# Patient Record
Sex: Male | Born: 1965 | Race: White | Hispanic: No | Marital: Married | Smoking: Never smoker
Health system: Southern US, Community
[De-identification: ages and names within clinical notes are randomized; demographics above are authoritative.]

## PROBLEM LIST (undated history)

## (undated) DIAGNOSIS — I10 Essential (primary) hypertension: Secondary | ICD-10-CM

## (undated) DIAGNOSIS — E785 Hyperlipidemia, unspecified: Secondary | ICD-10-CM

## (undated) HISTORY — PX: TONSILECTOMY, ADENOIDECTOMY, BILATERAL MYRINGOTOMY AND TUBES: SHX2538

## (undated) HISTORY — DX: Essential (primary) hypertension: I10

## (undated) HISTORY — PX: COLONOSCOPY: SHX174

## (undated) HISTORY — PX: APPENDECTOMY: SHX54

## (undated) HISTORY — PX: MYRINGOTOMY: SUR874

## (undated) HISTORY — DX: Hyperlipidemia, unspecified: E78.5

## (undated) HISTORY — PX: OTHER SURGICAL HISTORY: SHX169

## (undated) HISTORY — PX: TONSILLECTOMY: SUR1361

---

## 2021-10-05 ENCOUNTER — Other Ambulatory Visit
Admission: RE | Admit: 2021-10-05 | Discharge: 2021-10-05 | Disposition: A | Discharge status: Home / Self Care | Source: Ambulatory Visit | Attending: Family Medicine | Admitting: Family Medicine

## 2021-10-05 DIAGNOSIS — R0602 Shortness of breath: Secondary | ICD-10-CM | POA: Diagnosis not present

## 2021-10-05 DIAGNOSIS — R4189 Other symptoms and signs involving cognitive functions and awareness: Secondary | ICD-10-CM | POA: Diagnosis present

## 2021-10-05 LAB — D-DIMER, QUANTITATIVE: D-Dimer, Quant: <0.27 ug/mL-FEU (ref 0.00–0.50)

## 2021-10-05 LAB — TROPONIN I (HIGH SENSITIVITY): Troponin I (High Sensitivity): 3 ng/L (ref <18)

## 2022-04-09 ENCOUNTER — Other Ambulatory Visit: Payer: Self-pay | Admitting: Internal Medicine

## 2022-04-09 ENCOUNTER — Other Ambulatory Visit (HOSPITAL_COMMUNITY): Payer: Self-pay | Admitting: Internal Medicine

## 2022-04-09 DIAGNOSIS — M545 Low back pain, unspecified: Secondary | ICD-10-CM

## 2022-04-14 ENCOUNTER — Ambulatory Visit
Admission: RE | Admit: 2022-04-14 | Discharge: 2022-04-14 | Disposition: A | Discharge status: Home / Self Care | Source: Ambulatory Visit | Attending: Internal Medicine | Admitting: Internal Medicine

## 2022-04-14 DIAGNOSIS — M545 Low back pain, unspecified: Secondary | ICD-10-CM | POA: Insufficient documentation

## 2022-06-14 ENCOUNTER — Emergency Department

## 2022-06-14 ENCOUNTER — Inpatient Hospital Stay: Admission: RE | Admit: 2022-06-14 | Payer: Self-pay | Source: Ambulatory Visit

## 2022-06-14 ENCOUNTER — Emergency Department
Admission: EM | Admit: 2022-06-14 | Discharge: 2022-06-15 | Disposition: A | Discharge status: Home / Self Care | Attending: Emergency Medicine | Admitting: Emergency Medicine

## 2022-06-14 ENCOUNTER — Other Ambulatory Visit: Payer: Self-pay

## 2022-06-14 DIAGNOSIS — R519 Headache, unspecified: Secondary | ICD-10-CM | POA: Insufficient documentation

## 2022-06-14 DIAGNOSIS — H579 Unspecified disorder of eye and adnexa: Secondary | ICD-10-CM

## 2022-06-14 DIAGNOSIS — H5712 Ocular pain, left eye: Secondary | ICD-10-CM | POA: Diagnosis not present

## 2022-06-14 DIAGNOSIS — J40 Bronchitis, not specified as acute or chronic: Secondary | ICD-10-CM | POA: Diagnosis not present

## 2022-06-14 DIAGNOSIS — I10 Essential (primary) hypertension: Secondary | ICD-10-CM | POA: Diagnosis not present

## 2022-06-14 DIAGNOSIS — R059 Cough, unspecified: Secondary | ICD-10-CM | POA: Diagnosis present

## 2022-06-14 LAB — CBC WITH DIFFERENTIAL/PLATELET
Abs Immature Granulocytes: 0.13 10*3/uL — ABNORMAL HIGH (ref 0.00–0.07)
Basophils Absolute: 0.1 10*3/uL (ref 0.0–0.1)
Basophils Relative: 1 %
Eosinophils Absolute: 0.3 10*3/uL (ref 0.0–0.5)
Eosinophils Relative: 4 %
HCT: 44 % (ref 39.0–52.0)
Hemoglobin: 15 g/dL (ref 13.0–17.0)
Immature Granulocytes: 2 %
Lymphocytes Relative: 20 %
Lymphs Abs: 1.4 10*3/uL (ref 0.7–4.0)
MCH: 29.9 pg (ref 26.0–34.0)
MCHC: 34.1 g/dL (ref 30.0–36.0)
MCV: 87.8 fL (ref 80.0–100.0)
Monocytes Absolute: 0.6 10*3/uL (ref 0.1–1.0)
Monocytes Relative: 9 %
Neutro Abs: 4.4 10*3/uL (ref 1.7–7.7)
Neutrophils Relative %: 64 %
Platelets: 270 10*3/uL (ref 150–400)
RBC: 5.01 MIL/uL (ref 4.22–5.81)
RDW: 12.3 % (ref 11.5–15.5)
WBC: 6.8 10*3/uL (ref 4.0–10.5)
nRBC: 0 % (ref 0.0–0.2)

## 2022-06-14 LAB — COMPREHENSIVE METABOLIC PANEL
ALT: 27 U/L (ref 0–44)
AST: 22 U/L (ref 15–41)
Albumin: 4.6 g/dL (ref 3.5–5.0)
Alkaline Phosphatase: 61 U/L (ref 38–126)
Anion gap: 5 (ref 5–15)
BUN: 15 mg/dL (ref 6–20)
CO2: 26 mmol/L (ref 22–32)
Calcium: 9.3 mg/dL (ref 8.9–10.3)
Chloride: 107 mmol/L (ref 98–111)
Creatinine, Ser: 0.92 mg/dL (ref 0.61–1.24)
GFR, Estimated: >60 mL/min (ref >60)
Glucose, Bld: 125 mg/dL — ABNORMAL HIGH (ref 70–99)
Potassium: 4.1 mmol/L (ref 3.5–5.1)
Sodium: 138 mmol/L (ref 135–145)
Total Bilirubin: 0.7 mg/dL (ref 0.3–1.2)
Total Protein: 7.2 g/dL (ref 6.5–8.1)

## 2022-06-14 MED ORDER — TETRACAINE HCL 0.5 % OP SOLN
2.0000 [drp] | Freq: Once | OPHTHALMIC | Status: DC
  Filled 2022-06-14: qty 4

## 2022-06-14 MED ORDER — FLUORESCEIN SODIUM 1 MG OP STRP
1.0000 | ORAL_STRIP | Freq: Once | OPHTHALMIC | Status: DC
  Filled 2022-06-14: qty 1

## 2022-06-14 NOTE — ED Notes (Signed)
Provider JM and student PA at bedside.

## 2022-06-14 NOTE — ED Triage Notes (Signed)
Pt states he started having left eye pressure 3 weeks ago that has gotten worse, and c/o cough x several weeks that had two negative covid tests in the ER. Pt has not yet been to eye doctor- was sent to ED by [employer]. Pt states the cough makes the eye pressure worse, and had started to become productive. Pt denies nasal congestion, denies CP, SHOB, N/V/D. Denies visual problems. Pt c/o headache. Pt is AOX4,NAD noted. No redness, swelling or discharge noted to left eye- cough noted.

## 2022-06-14 NOTE — ED Provider Notes (Incomplete)
[facility] Emergency Department Provider Note     Event Date/Time   First MD Initiated Contact with Patient 06/14/22 2146     (approximate)   History   Eye Pain and Cough   HPI  Gregory Petty is a 56 y.o. male with HTN, hyperlipidemia, and chronic lumbar stenosis, presents to the ED for evaluation of left eye pressure with onset 3 weeks ago. He notes persistent symptoms and pain and pressure exacerbated by a recently developed cough. He denies vision changes, floaters, red eye, nausea, or history of eye disease. He wears glasses but has not been to the eye doctor for this complaint. He denies light sensitivity, tearing, matting crusting,       Physical Exam   Triage Vital Signs: ED Triage Vitals [06/14/22 1928]  Enc Vitals Group     BP (!) 128/95     Pulse Rate 90     Resp 19     Temp 98.9 F (37.2 C)     Temp Source Oral     SpO2 97 %     Weight      Height      Head Circumference      Peak Flow      Pain Score 6     Pain Loc      Pain Edu?      Excl. in GC?     Most recent vital signs: Vitals:   06/14/22 2241 06/15/22 0018  BP: (!) 142/102 (!) 138/95  Pulse: 81 72  Resp: 19 18  Temp:  98.6 F (37 C)  SpO2: 95% 97%    General Awake, no distress. NAD HEENT NCAT. PERRL. EOMI. Mild subjective pain with medial/lateral movement. No gross globe abnormality. No hyphema, no fluorescein dye uptake. Normal fundi bilaterally. No cupping, papilledema, or cherry red spot. No rhinorrhea. Mucous membranes are moist.  CV:  Good peripheral perfusion.  RESP:  Normal effort.  ABD:  No distention.    ED Results / Procedures / Treatments   Labs (all labs ordered are listed, but only abnormal results are displayed) Labs Reviewed  COMPREHENSIVE METABOLIC PANEL - Abnormal; Notable for the following components:      Result Value   Glucose, Bld 125 (*)    All other components within normal limits  CBC WITH DIFFERENTIAL/PLATELET - Abnormal;  Notable for the following components:   Abs Immature Granulocytes 0.13 (*)    All other components within normal limits     EKG   RADIOLOGY  I personally viewed and evaluated these images as part of my medical decision making, as well as reviewing the written report by the radiologist.  ED Provider Interpretation: no acute findings}  CT Head w/o CM  IMPRESSION: No acute intracranial abnormality.   PROCEDURES:  Critical Care performed: No  Procedures   MEDICATIONS ORDERED IN ED: Medications - No data to display    IMPRESSION / MDM / ASSESSMENT AND PLAN / ED COURSE  I reviewed the triage vital signs and the nursing notes.                              Differential diagnosis includes, but is not limited to, CRAO, CRVO, ocular migraine, corneal abrasion, retinal detachment, uveitis, optic neuritis   Patient's presentation is most consistent with acute complicated illness / injury requiring diagnostic workup.   ----------------------------------------- 11:41 PM on 06/15/2022 ----------------------------------------- S/W Dr. Willey Blade (Ophth):  discussed the case in detail. He appreciated the thorough work-up including CT imaging. He suggest patient be discharged with a  steroid taper for presumed idiopathic neuritis. He will see the patient in the office next week.  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 Patient with and otherwise normal exam without acute vision loss. Patient will be discharged home with prescriptions for methylprednisolone and azithromycin for his bronchitis. Patient is to follow up with [facility] as needed or otherwise directed. Patient is given ED precautions to return to the ED for any worsening or new symptoms.     FINAL CLINICAL IMPRESSION(S) / ED DIAGNOSES   Final diagnoses:  Eye pressure  Bronchitis  Left eye pain     Rx / DC Orders   ED Discharge Orders          Ordered    azithromycin  (ZITHROMAX Z-PAK) 250 MG tablet        06/15/22 0004    methylPREDNISolone (MEDROL DOSEPAK) 4 MG TBPK tablet        06/15/22 0004             Note:  This document was prepared using Dragon voice recognition software and may include unintentional dictation errors.    Lissa Hoard, PA-C 06/17/22 2234    Lissa Hoard, PA-C 06/17/22 2238    Delton Prairie, MD 06/20/22 [phone]

## 2022-06-14 NOTE — ED Notes (Signed)
Drops and strip on bedside table. Pt reports about 3 weeks ago started having L eye pain; sharp with pressure; denies mechanism of injury; then about 1 week after that started having dry cough; states not congested and cough now productive with yellow phlegm; pt denies vision changes; pt had two covid tests completed recently; pt was sent home with albuterol per pt.

## 2022-06-15 MED ORDER — AZITHROMYCIN 250 MG PO TABS: ORAL_TABLET | ORAL | 0 refills | Status: DC

## 2022-06-15 MED ORDER — METHYLPREDNISOLONE 4 MG PO TBPK: ORAL_TABLET | ORAL | 0 refills | Status: DC

## 2022-06-15 NOTE — Discharge Instructions (Addendum)
Your exam and head CT are normal and reassuring. You are being treated for idiopathic nerve irritation with oral steroids. Follow-up with Dr. Brooke Dare at [facility] next week. Return if needed.

## 2022-09-24 ENCOUNTER — Ambulatory Visit (INDEPENDENT_AMBULATORY_CARE_PROVIDER_SITE_OTHER): Admitting: Neurosurgery

## 2022-09-24 ENCOUNTER — Encounter: Payer: Self-pay | Admitting: Neurosurgery

## 2022-09-24 VITALS — BP 142/97 | HR 72 | Ht 69.0 in | Wt 213.4 lb

## 2022-09-24 DIAGNOSIS — M545 Low back pain, unspecified: Secondary | ICD-10-CM | POA: Diagnosis not present

## 2022-09-24 DIAGNOSIS — G8929 Other chronic pain: Secondary | ICD-10-CM | POA: Diagnosis not present

## 2022-09-24 NOTE — Progress Notes (Signed)
Referring Physician:  No referring provider defined for this encounter.  Primary Physician:  Enid Baas, MD  History of Present Illness: 09/24/2022 [name] is here today with a chief complaint of intermittent bilateral low back pain.  He has no radicular pain.  He has done physical therapy and had injections.  He did have a postinjection flare, but he is doing better now.  He is able to walk but cannot run due to the pounding on his back.  04/23/2022 [name] is here today with a chief complaint of bilateral low back pain without radiation into the legs. Been having problems intermittently over several years. He typically is able to get his symptoms under control with physical therapy and exercises. He is taking Naprosyn currently as well which helps. He is currently too tight to be able to do many of the exercises. He is working as an Optician, dispensing. He is able to perform his job duties, but is having significant pain.  Activity, right hip abduction, and external rotation make it worse. Physical therapy and naproxen have helped somewhat. His pain is constant. Bowel/Bladder Dysfunction: none  Conservative measures: dry needling, heat, rest Physical therapy: participated at [facility] 02/04/22-03/29/22 Multimodal medical therapy including regular antiinflammatories: tylenol, naproxen, flexeril Injections: has not had epidural steroid injections  Past Surgery: denies  Gregory Petty has no symptoms of cervical myelopathy.  The symptoms are causing a significant impact on the patient's life.   Review of Systems:  A 10 point review of systems is negative, except for the pertinent positives and negatives detailed in the HPI.  Past Medical History: Past Medical History:  Diagnosis Date   Hyperlipidemia    Hypertension     Past Surgical History: Past Surgical History:  Procedure Laterality Date   APPENDECTOMY     Arthoscopy wrist Left     COLONOSCOPY     MYRINGOTOMY     Nasal septoplasty and turbinate ablation     TONSILECTOMY, ADENOIDECTOMY, BILATERAL MYRINGOTOMY AND TUBES     TONSILLECTOMY      Allergies: Allergies as of 09/24/2022 - Review Complete 09/24/2022  Allergen Reaction Noted   Crestor [rosuvastatin calcium]  09/24/2022   Zocor [simvastatin]  09/24/2022    Medications: Current Meds  Medication Sig   atorvastatin (LIPITOR) 20 MG tablet Take 1 tablet by mouth daily.   Azelastine HCl 137 MCG/SPRAY SOLN Place into the nose daily.   fluticasone (FLONASE ALLERGY RELIEF) 50 MCG/ACT nasal spray Place into both nostrils daily.   lisinopril (ZESTRIL) 10 MG tablet Take 1 tablet by mouth daily.    Social History: Social History   Tobacco Use   Smoking status: Never   Smokeless tobacco: Never    Family Medical History: No family history on file.  Physical Examination: Vitals:   09/24/22 0933  BP: (!) 142/97  Pulse: 72    General: Patient is well developed, well nourished, calm, collected, and in no apparent distress. Attention to examination is appropriate.  Neck:   Supple.  Full range of motion.  Respiratory: Patient is breathing without any difficulty.   NEUROLOGICAL:     Awake, alert, oriented to person, place, and time.  Speech is clear and fluent. Fund of knowledge is appropriate.   Cranial Nerves: Pupils equal round and reactive to light.  Facial tone is symmetric.  Facial sensation is symmetric. Shoulder shrug is symmetric. Tongue protrusion is midline.  There is no pronator drift.  ROM of spine:  full.    Strength: Side Biceps Triceps Deltoid Interossei Grip Wrist Ext. Wrist Flex.  R 5 5 5 5 5 5 5   L 5 5 5 5 5 5 5    Side Iliopsoas Quads Hamstring PF DF EHL  R 5 5 5 5 5 5   L 5 5 5 5 5 5   Hoffman's is absent.   Bilateral upper and lower extremity sensation is intact to light touch.    No evidence of dysmetria noted.  Gait is normal.     Medical Decision Making  Imaging: MRI  L spine 04/14/2022 IMPRESSION:  1. Mild degenerative disc disease of L2-L3 and L5-S1.  2. Mild left foraminal stenosis at L5-S1. No canal stenosis at any  level.   Electronically Signed    By: Davina Poke D.O.    On: 04/15/2022 14:51  I have personally reviewed the images and agree with the above interpretation.  Assessment and Plan: Gregory Petty is a pleasant 56 y.o. male with chronic bilateral low back pain without sciatica.  He is currently stable with improvement of his pain though he does have intermittent exacerbations.  I have recommended that he continue with his exercises for now.  He may need additional injections in the future but not currently.  I will see him back in a year to assess his progress.   I spent a total of 15 minutes in face-to-face and non-face-to-face activities related to this patient's care today.  Thank you for involving me in the care of this patient.      Orpheus Hayhurst K. Izora Ribas MD, [facility] Neurosurgery

## 2023-01-16 ENCOUNTER — Other Ambulatory Visit: Payer: Self-pay | Admitting: Internal Medicine

## 2023-01-16 DIAGNOSIS — I2510 Atherosclerotic heart disease of native coronary artery without angina pectoris: Secondary | ICD-10-CM

## 2023-02-11 ENCOUNTER — Telehealth (HOSPITAL_COMMUNITY): Payer: Self-pay | Admitting: *Deleted

## 2023-02-11 MED ORDER — METOPROLOL TARTRATE 100 MG PO TABS: ORAL_TABLET | ORAL | 0 refills | Status: AC

## 2023-02-11 NOTE — Telephone Encounter (Signed)
Reaching out to patient to offer assistance regarding upcoming cardiac imaging study; pt verbalizes understanding of appt date/time. He is currently in [place] and asked me to call his wife regarding the instructions.  Confirmed with patient his pharmacy and allergies. He is aware of NTG use during test and to avoid sildenafil 48 hours prior to his CCTA.  Gordy Clement RN Navigator Cardiac Imaging [facility] [phone] office [phone] cell

## 2023-02-12 ENCOUNTER — Telehealth (HOSPITAL_COMMUNITY): Payer: Self-pay | Admitting: *Deleted

## 2023-02-12 NOTE — Telephone Encounter (Signed)
Patient's wife left a message saying the patient won't be able to make his CCTA appt due to work.  Canceling appt for now.  Gordy Clement RN Navigator Cardiac Imaging [facility] [phone] Office [phone] Cell

## 2023-02-12 NOTE — Telephone Encounter (Signed)
Reaching out to patient's wife (Per pt request) to offer assistance regarding upcoming cardiac imaging study; pt's wife verbalizes understanding of appt date/time, parking situation and where to check in, pre-test NPO status and medications ordered, and verified current allergies; name and call back number provided for further questions should they arise  Gordy Clement RN Navigator Cardiac Imaging [facility] [phone] office [phone] cell  Patient to hold his lisinopril and take '100mg'$  metoprolol tartrate two hours prior to his cardiac CT scan.

## 2023-02-13 ENCOUNTER — Ambulatory Visit: Admission: RE | Admit: 2023-02-13 | Source: Ambulatory Visit

## 2023-03-18 ENCOUNTER — Telehealth (HOSPITAL_COMMUNITY): Payer: Self-pay | Admitting: Emergency Medicine

## 2023-03-18 NOTE — Telephone Encounter (Signed)
Reaching out to patient to offer assistance regarding upcoming cardiac imaging study; pt verbalizes understanding of appt date/time, parking situation and where to check in, pre-test NPO status and medications ordered, and verified current allergies; name and call back number provided for further questions should they arise Gregory Bond RN Navigator Cardiac Imaging [facility] [phone] office [phone] cell  Arrival 1000 [facility] Denies iv issues 100mg  metoprolol tartrate Aware contrast/nitro

## 2023-03-20 ENCOUNTER — Ambulatory Visit
Admission: RE | Admit: 2023-03-20 | Discharge: 2023-03-20 | Disposition: A | Discharge status: Home / Self Care | Source: Ambulatory Visit | Attending: Internal Medicine | Admitting: Internal Medicine

## 2023-03-20 DIAGNOSIS — I2510 Atherosclerotic heart disease of native coronary artery without angina pectoris: Secondary | ICD-10-CM | POA: Insufficient documentation

## 2023-03-20 MED ORDER — NITROGLYCERIN 0.4 MG SL SUBL
0.8000 mg | SUBLINGUAL_TABLET | Freq: Once | SUBLINGUAL | Status: AC
  Administered 2023-03-20: 0.8 mg via SUBLINGUAL

## 2023-03-20 MED ORDER — IOHEXOL 350 MG/ML SOLN
75.0000 mL | Freq: Once | INTRAVENOUS | Status: AC | PRN
  Administered 2023-03-20: 75 mL via INTRAVENOUS

## 2023-03-20 NOTE — Progress Notes (Signed)
Patient tolerated CT well. Drank water after. Vital signs stable encourage to drink water throughout day.Reasons explained and verbalized understanding. Ambulated steady gait.  

## 2023-06-17 ENCOUNTER — Encounter: Payer: Self-pay | Admitting: Internal Medicine

## 2023-06-17 DIAGNOSIS — G4719 Other hypersomnia: Secondary | ICD-10-CM

## 2023-07-04 NOTE — Procedures (Signed)
[facility]  Polysomnogram Report Part I                                                               Phone: [phone] Fax: [phone]  Patient Name: Gregory Petty, Gregory Petty. Acquisition Number: [id number]  Date of Birth: 08/28/1966 Acquisition Date: 06/17/2023  Referring Physician: Dani Gobble, MD     History: The patient is a 57 year old  who was referred for evaluation of . Medical History: hypertension, depression, hyperlipidemia, back pain, abnormal EKG, insomnia, chronic rhinitis and obesity.  Medications: azelastine, budesonide, ergocalciferol, atorvastatin, ezetimibe, famotidine, fluticasone propionate, lisinopril, montelukast, naproxan, phentermine, prednisone, slidenafil and valacyclavir.  Procedure: This routine overnight polysomnogram was performed on the [facility] 5 using the standard diagnostic protocol. This included 6 channels of EEG, 2 channels of EOG, chin EMG, bilateral anterior tibialis EMG, nasal/oral thermistor, PTAF (nasal pressure transducer), chest and abdominal wall movements, EKG, and pulse oximetry.  Description: The total recording time was 400.3 minutes. The total sleep time was 349.5 minutes. There were a total of 44.2 minutes of wakefulness after sleep onset for areducedsleep efficiency of 87.3%. The latency to sleep onset was shortat 6.6 minutes. The R sleep onset latency was within normal limits at 98.0 minutes. Sleep parameters, as a percentage of the total sleep time, demonstrated 20.3% of sleep was in N1 sleep, 53.4% N2, 9.4% N3 and 16.9% R sleep. There were a total of 171 arousals for an arousal index of 29.4 arousals per hour of sleep that was elevated.  Respiratory monitoring demonstrated   snoring . There were 129 apneas and hypopneas for an Apnea Hypopnea Index of 22.1 apneas and hypopneas per hour of sleep. The REM related apnea hypopnea index was 46.8/hr of REM sleep compared to a NREM AHI of 17.1/hr. The Respiratory Disturbance Index, which includes  53 respiratory effort related arousals (RERAs), was 31.2 respiratory events per hour of sleep.  The average duration of the respiratory events was 26.3 seconds with a maximum duration of 85.5 seconds. The respiratory events occurred in all positions, however they were more frequent in the supine position with an AHI of 39.3. The respiratory events were associated with peripheral oxygen desaturations on the average to 86%. The lowest oxygen desaturation associated with a respiratory event was 45%. Additionally, the baseline oxygen saturation during wakefulness was 93%, during NREM sleep averaged 94%, and during REM sleep averaged 91%. The total duration of oxygen < 90% was 23.7 minutes and <80% was 7.0 minutes.  Cardiac monitoring-  demonstrate transient cardiac decelerations associated with the apneas.  significant cardiac rhythm irregularities.   Periodic limb movement monitoring- did not demonstrate periodic limb movements.   Impression: This routine overnight polysomnogram demonstrated significant obstructive sleep apnea with an overall Apnea Hypopnea Index of 22.1 apneas and hypopneas per hour of sleep. The respiratory events were more severe in supine and in REM sleep with AHIs of 39.3 and 46.8, respectively. The respiratory events were more  prolonged in REM the lowest desaturation to 45%. As REM percentage was reduced, the findings likely underestimate the severity of the sleep apnea.   reduced sleep efficiency with anelevated arousal index,increased awakenings and reduced percentages of REM and slow wave sleep. These findings would appear to be due to the obstructive sleep  apnea.  Recommendations:    A CPAP titration would be recommended due to the severity of the sleep apnea. Some supine sleep should be ensured to optimize the titration. Additionally, would recommend weight loss in a patient with a BMI of 30.0.     Yevonne Pax, MD, [facility] Diplomate ABMS-Pulmonary, Critical Care and Sleep  Medicine  Electronically reviewed and digitally signed   [facility] Polysomnogram Report Part II  Phone: [phone] Fax: [phone]  Patient last name Seeney Neck Size 17.5 in. Acquisition [id number]  Patient first name Gregory Petty. Weight 203.0 lbs. Started 06/17/2023 at 11:28:47 PM  Birth date November 17, 1966 Height 69.0 in. Stopped 06/18/2023 at 7:26:29 AM  Age 72 BMI 30.0 lb/in2 Duration 400.3  Study Type Adult      Report generated by Otho Perl, RPSGT  Reviewed by: Valentino Hue. Henke, PhD, ABSM, FAASM Sleep Data: Lights Out: 11:36:11 PM Sleep Onset: 11:42:47 PM  Lights On: 6:16:29 AM Sleep Efficiency: 87.3 %  Total Recording Time: 400.3 min Sleep Latency (from Lights Off) 6.6 min  Total Sleep Time (TST): 349.5 min R Latency (from Sleep Onset): 98.0 min  Sleep Period Time: 392.5 min Total number of awakenings: 49  Wake during sleep: 43.0 min Wake After Sleep Onset (WASO): 44.2 min   Sleep Data:         Arousal Summary: Stage  Latency from lights out (min) Latency from sleep onset (min) Duration (min) % Total Sleep Time  Normal values  N 1 6.6 0.0 71.0 20.3 (5%)  N 2 12.1 5.5 186.5 53.4 (50%)  N 3 21.6 15.0 33.0 9.4 (20%)  R 104.6 98.0 59.0 16.9 (25%)   Number Index  Spontaneous 87 14.9  Apneas & Hypopneas 62 10.6  RERAs 53 9.1       (Apneas & Hypopneas & RERAs)  (115) (19.7)  Limb Movement 19 3.3  Snore 0 0.0  TOTAL 221 37.9     Respiratory Data:  CA OA MA Apnea Hypopnea* A+ H RERA Total  Number 1 69 11 81 48 129 53 182  Mean Dur (sec) 11.0 23.1 28.8 23.8 30.6 26.3 26.1 26.3  Max Dur (sec) 11.0 85.5 64.0 85.5 83.0 85.5 46.0 85.5  Total Dur (min) 0.2 26.6 5.3 32.1 24.5 56.6 23.1 79.6  % of TST 0.1 7.6 1.5 9.2 7.0 16.2 6.6 22.8  Index (#/h TST) 0.2 11.8 1.9 13.9 8.2 22.1 9.1 31.2  *Hypopneas scored based on 4% or greater desaturation.  Sleep Stage:        REM NREM TST  AHI 46.8 17.1 22.1  RDI 52.9 26.9 31.2           Body Position Data:   Sleep (min) TST (%) REM (min) NREM (min) CA (#) OA (#) MA (#) HYP (#) AHI (#/h) RERA (#) RDI (#/h) Desat (#)  Supine 140.5 40.20 20.5 120.0 1 53 8 30 39.3 15 45.7 96  Non-Supine 209.00 59.80 38.50 170.50 0.00 16.00 3.00 18.00 10.62 38.00 21.53 49.00  Left: 209.0 59.80 38.5 170.5 0 16 3 18  10.6 38 21.5 49     Snoring: Total number of snoring episodes  0  Total time with snoring    min (   % of sleep)   Oximetry Distribution:             WK REM NREM TOTAL  Average (%)   93 91 94 93  < 90% 4.5 14.4 4.8 23.7  < 80% 2.0 4.8 0.2  7.0  < 70% 1.5 2.0 0.1 3.6  # of Desaturations* 9 37 65 111  Desat Index (#/hour) 10.6 37.6 13.4 19.1  Desat Max (%) 27 37 21 37  Desat Max Dur (sec) 56.0 120.0 103.0 120.0  Approx Min O2 during sleep 45  Approx min O2 during a respiratory event 45  Was Oxygen added (Y/N) and final rate :    LPM  *Desaturations based on 4% or greater drop from baseline.   Cheyne Stokes Breathing: None Present   Heart Rate Summary:  Average Heart Rate During Sleep 64.1 bpm      Highest Heart Rate During Sleep (95th %) 75.0 bpm      Highest Heart Rate During Sleep 190 bpm (artifact)  Highest Heart Rate During Recording (TIB) 221 bpm (artifact)   Heart Rate Observations: Event Type # Events   Bradycardia 0 Lowest HR Scored: N/A  Sinus Tachycardia During Sleep 0 Highest HR Scored: N/A  Narrow Complex Tachycardia 0 Highest HR Scored: N/A  Wide Complex Tachycardia 0 Highest HR Scored: N/A  Asystole 0 Longest Pause: N/A  Atrial Fibrillation 0 Duration Longest Event: N/A  Other Arrythmias   Type:    Periodic Limb Movement Data: (Primary legs unless otherwise noted) Total # Limb Movement 21 Limb Movement Index 3.6  Total # PLMS    PLMS Index     Total # PLMS Arousals    PLMS Arousal Index     Percentage Sleep Time with PLMS   min (   % sleep)  Mean Duration limb movements (secs)

## 2023-07-23 ENCOUNTER — Encounter: Payer: Self-pay | Admitting: Internal Medicine

## 2023-07-23 DIAGNOSIS — G4733 Obstructive sleep apnea (adult) (pediatric): Secondary | ICD-10-CM

## 2023-07-25 NOTE — Procedures (Signed)
[facility]  Polysomnogram Report Part I  Phone: [phone] Fax: [phone]  Patient Name: Gregory Petty, Gregory Petty. Acquisition Number: [id number]  Date of Birth: 1966-05-29 Acquisition Date: 07/23/2023  Referring Physician: Linde Gillis MD     History: The patient is a 57 year old male with obstructive sleep apnea for CPAP titration. Medical History: Hypertension, depression, hyperlipidemia, back pain, abnormal EKG, insomnia, chronic rhinitis and obesity.  Medications: Azelastine, budesonide, ergocalciferol, atorvastatin, ezetimibe, famotidine, fluticasone propionate, lisinopril, montelukast, naproxen, phentermine, prednisone, sildenafil and valacyclovir.  Procedure: This routine overnight polysomnogram was performed on the [facility] 5 using the standard CPAP protocol. This included 6 channels of EEG, 2 channels of EOG, chin EMG, bilateral anterior tibialis EMG, nasal/oral thermistor, PTAF (nasal pressure transducer), chest and abdominal wall movements, EKG, and pulse oximetry.  Description: The total recording time was 389.5 minutes. The total sleep time was 284.5 minutes. There were a total of 93.5 minutes of wakefulness after sleep onset for areducedsleep efficiency of 73.0%. The latency to sleep onset was within normal limitsat 11.5 minutes. The R sleep onset latency was within normal limits at 111.5 minutes. Sleep parameters, as a percentage of the total sleep time, demonstrated 5.8% of sleep was in N1 sleep, 62.0% N2, 0.0% N3 and 32.2% R sleep. There were a total of 26 arousals for an arousal index of 5.5 arousals per hour of sleep that was normal.  Overall, there were a total of 39 respiratory events for a respiratory disturbance index, which includes apneas, hypopneas and RERAs (increased respiratory effort) of 8.2 respiratory events per hour of sleep during the pressure titration.  Was initiated at 4 cm H2O at lights out, 11:22 p.m. It was titrated in 1-2 cm increments for obstructive  events to 9 cm H2O. The apnea was controlled at this pressure and supine, REM sleep was observed.  The pressure was further titrated to 11 cm H2O. Sleep became disrupted and central apneas emerged. The pressure was reduced to the final pressure of 8.0 cm H2O.   Additionally, the baseline oxygen saturation during wakefulness was 87%, during NREM sleep averaged 94%, and during REM sleep averaged 95%. The total duration of oxygen < 90% was 8.4 minutes and <80% was 8.2 minutes.  Cardiac monitoring-  significant cardiac rhythm irregularities.   Periodic limb movement monitoring- did not demonstrate periodic limb movements.    Impression: This patient's obstructive sleep apnea demonstrated significant improvement with the utilization of nasal at 9 cm H2O.   Recommendations: Would recommend utilization of nasal  at 9 cm H2O.      An AIRFIT N20 mask, size MEDUIM, was used. Chin strap used during study- NO. Humidifier used during study- YES.     Yevonne Pax, MD, [facility] Diplomate ABMS-Pulmonary, Critical Care and Sleep Medicine  Electronically reviewed and digitally signed [facility] CPAP/BIPAP Polysomnogram Report Part II Phone: [phone] Fax: [phone]  Patient last name [place] Size    in. Acquisition [phone]  Patient first name Gregory Petty. Weight 203.0 lbs. Started 07/23/2023 at 11:17:01 PM  Birth date 06-03-1966 Height 69.0 in. Stopped 07/24/2023 at 5:55:37 AM  Age 79      Type Adult BMI 30.0 lb/in2 Duration 389.5  Robbi Garter. RPSGT / Loura Back  Reviewed by: Valentino Hue. Henke, PhD, ABSM, FAASM Sleep Data: Lights Out: 11:22:31 PM Sleep Onset: 11:34:01 PM  Lights On: 5:52:01 AM Sleep Efficiency: 73.0 %  Total Recording Time: 389.5 min Sleep Latency (from Lights Off) 11.5 min  Total Sleep Time (TST): 284.5 min R Latency (from Sleep Onset): 111.5 min  Sleep Period Time: 377.5 min Total number of awakenings: 19  Wake during sleep: 93.0 min Wake After Sleep Onset  (WASO): 93.5 min   Sleep Data:         Arousal Summary: Stage  Latency from lights out (min) Latency from sleep onset (min) Duration (min) % Total Sleep Time  Normal values  N 1 11.5 0.0 16.5 5.8 (5%)  N 2 12.0 0.5 176.5 62.0 (50%)  N 3       0.0 0.0 (20%)  R 123.0 111.5 91.5 32.2 (25%)   Number Index  Spontaneous 23 4.9  Apneas & Hypopneas 3 0.6  RERAs 0 0.0       (Apneas & Hypopneas & RERAs)  (3) (0.6)  Limb Movement 0 0.0  Snore 0 0.0  TOTAL 26 5.5     Respiratory Data:  CA OA MA Apnea Hypopnea* A+ H RERA Total  Number 24 8 0 32 7 39 0 39  Mean Dur (sec) 20.0 11.6 0.0 17.9 12.5 16.9 0.0 16.9  Max Dur (sec) 34.5 15.0 0.0 34.5 15.0 34.5 0.0 34.5  Total Dur (min) 8.0 1.6 0.0 9.6 1.5 11.0 0.0 11.0  % of TST 2.8 0.5 0.0 3.4 0.5 3.9 0.0 3.9  Index (#/h TST) 5.1 1.7 0.0 6.7 1.5 8.2 0.0 8.2  *Hypopneas scored based on 4% or greater desaturation.  Sleep Stage:         REM NREM TST  AHI 0.0 11.2 8.2  RDI 0.0 11.2 8.2   Sleep (min) TST (%) REM (min) NREM (min) CA (#) OA (#) MA (#) HYP (#) AHI (#/h) RERA (#) RDI (#/h) Desat (#)  Supine 229.5 80.67 50.0 179.5 24 8  0 7 10.2 0 10.2 56  Non-Supine 55.00 19.33 41.50 13.50 0.00 0.00 0.00 0.00 0.00 0 0.00 2.00  Left: 55.0 19.33 41.5 13.5 0 0 0 0 0.0 0 0.00 2     Snoring: Total number of snoring episodes  0  Total time with snoring    min (   % of sleep)   Oximetry Distribution:             WK REM NREM TOTAL  Average (%)   87 95 94 93  < 90% 8.2 0.0 0.2 8.4  < 80% 8.2 0.0 0.0 8.2  < 70% 8.2 0.0 0.0 8.2  # of Desaturations* 8 1 50 59  Desat Index (#/hour) 4.9 0.7 15.5 12.4  Desat Max (%) 8 4 9 9   Desat Max Dur (sec) 39.0 29.0 88.0 88.0  Approx Min O2 during sleep 89  Approx min O2 during a respiratory event 89  Was Oxygen added (Y/N) and final rate :    LPM  *Desaturations based on 3% or greater drop from baseline.   Cheyne Stokes Breathing: None Present   Heart Rate Summary:  Average Heart Rate During  Sleep 58.7 bpm      Highest Heart Rate During Sleep (95th %) 64.0 bpm      Highest Heart Rate During Sleep 179 bpm (artifact)  Highest Heart Rate During Recording (TIB) 216 bpm (artifact)   Heart Rate Observations: Event Type # Events   Bradycardia 0 Lowest HR Scored: N/A  Sinus Tachycardia During Sleep 0 Highest HR Scored: N/A  Narrow Complex Tachycardia 0 Highest HR Scored: N/A  Wide Complex Tachycardia 0 Highest HR Scored: N/A  Asystole 0 Longest Pause: N/A  Atrial  Fibrillation 0 Duration Longest Event: N/A  Other Arrythmias   Type:   Periodic Limb Movement Data: (Primary legs unless otherwise noted) Total # Limb Movement 0 Limb Movement Index 0.0  Total # PLMS    PLMS Index     Total # PLMS Arousals    PLMS Arousal Index     Percentage Sleep Time with PLMS   min (   % sleep)  Mean Duration limb movements (secs)       IPAP Level (cmH2O) EPAP Level (cmH2O) Total Duration (min) Sleep Duration (min) Sleep (%) REM (%) CA  #) OA # MA # HYP #) AHI (#/hr) RERAs # RERAs (#/hr) RDI (#/hr)  0 0 5.4 0.0 0.0 0.0 0 0 0 0 0.0 0 0.0 0.0  4 4 8.5 7.5 88.2 0.0 0 3 0 0 24.0 0 0.0 24.0  6 6 20.5 17.8 86.8 0.0 14 4 0 3 70.8 0 0.0 70.8  8 8  86.4 76.4 88.4 48.0 3 1 0 1 3.9 0 0.0 3.9  9 9  240.0 179.6 74.8 20.8 4 0 0 3 2.3 0 0.0 2.3  11 11  7.1 1.6 22.5 0.0 3 0 0 0 112.5 0 0.0 112.5

## 2023-09-18 IMAGING — MR MR LUMBAR SPINE W/O CM
5 series · 31 of 48 positions shown · non-contrast
Comparison: None.

CLINICAL DATA: Chronic low back pain

EXAM:
MRI LUMBAR SPINE WITHOUT CONTRAST
TECHNIQUE: Multiplanar, multisequence MR imaging of the lumbar spine was
performed. No intravenous contrast was administered.

[Series 5: T2 · sagittal · 4.0mm · 0.81mm/px · 6 of 17 slices shown (1 of 2)]
[im 1/17]
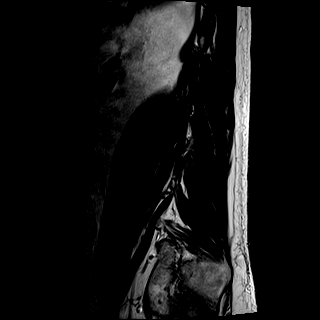
[im 4/17]
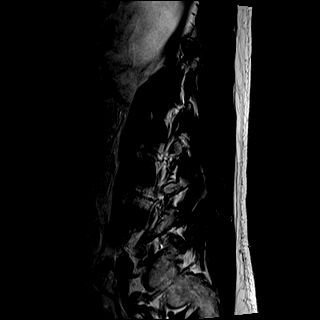
[im 7/17]
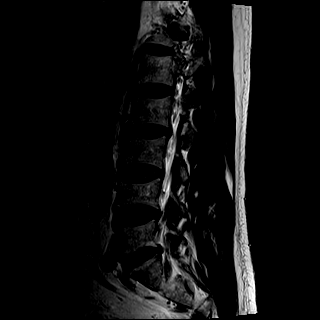
[im 10/17]
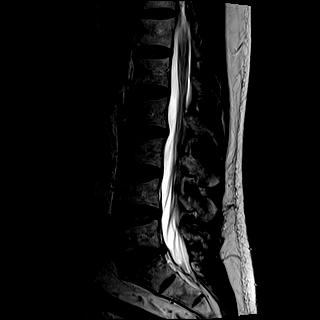
[im 13/17]
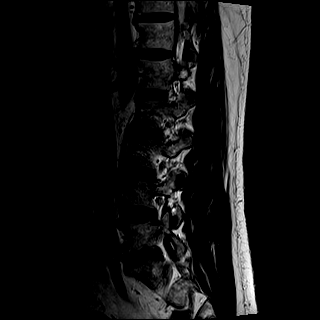
[im 17/17]
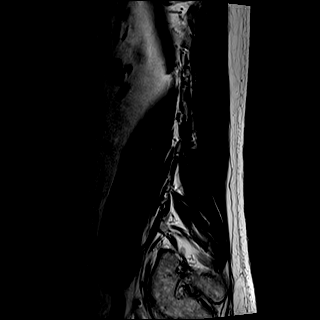

[Series 6: T1 · sagittal · 4.0mm · 0.81mm/px · 7 of 17 slices shown (1 of 2)]
[im 1/17]
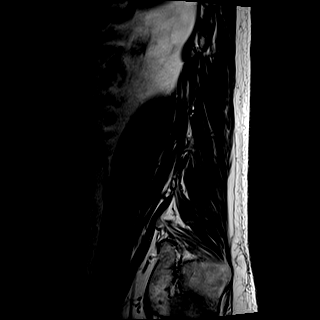
[im 3/17]
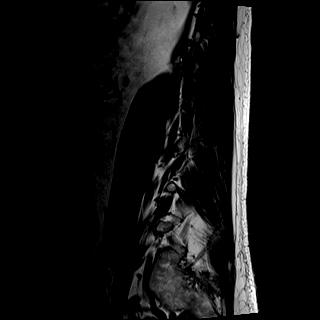
[im 6/17]
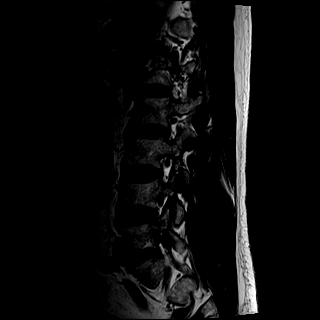
[im 9/17]
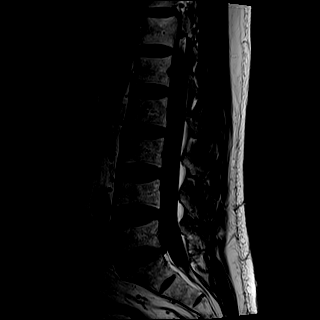
[im 11/17]
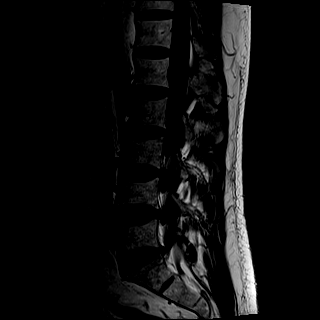
[im 14/17]
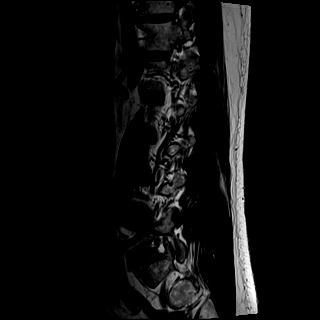
[im 17/17]
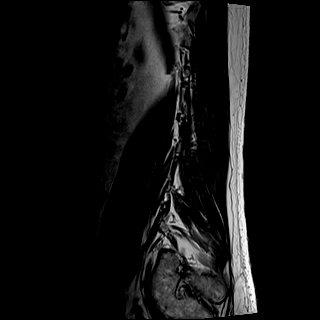

[Series 7: STIR · sagittal · 4.0mm · 0.41mm/px · 2 of 17 slices shown]
[im 1/17]
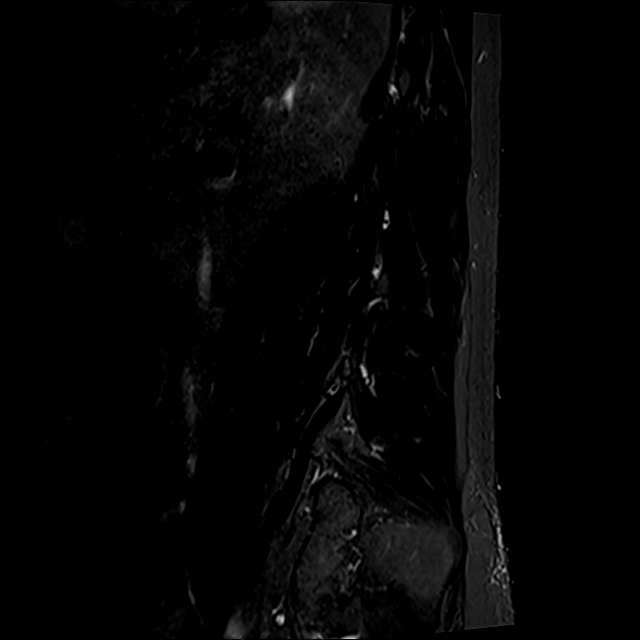
[im 3/17]
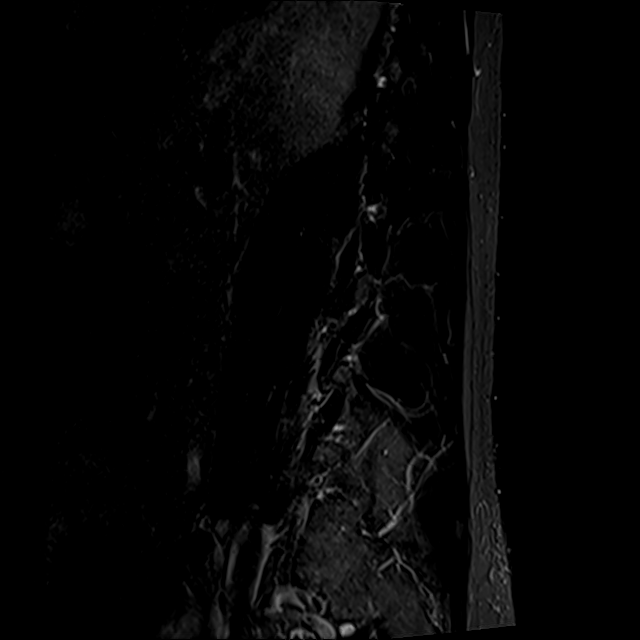

[Series 8: T2 · axial · 4.0mm · 0.78mm/px · z∈[-53,+150]mm · 8 of 36 slices shown (2 of 2)]
[im 1/36]
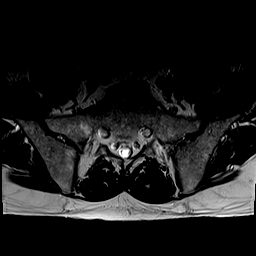
[im 6/36]
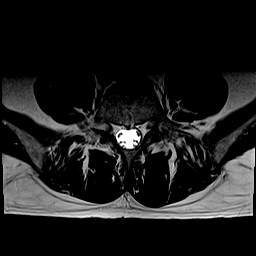
[im 11/36]
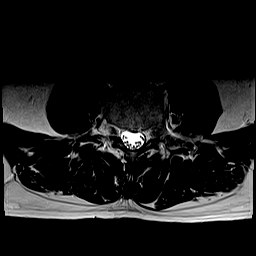
[im 17/36]
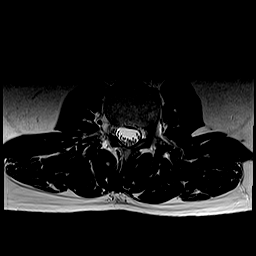
[im 19/36]
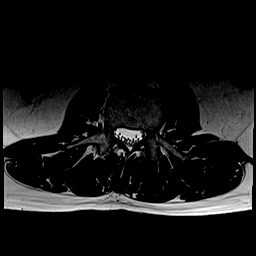
[im 25/36]
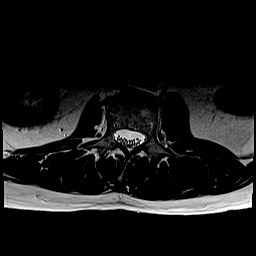
[im 30/36]
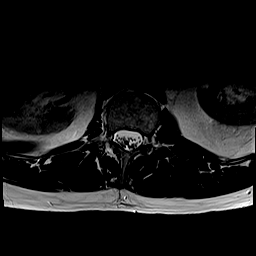
[im 36/36]
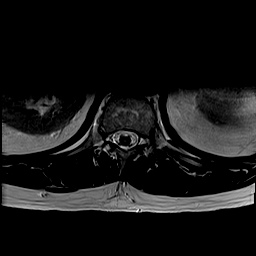

[Series 9: T1 · axial · 4.0mm · 0.39mm/px · z∈[-53,+150]mm · 8 of 36 slices shown (2 of 2)]
[im 1/36]
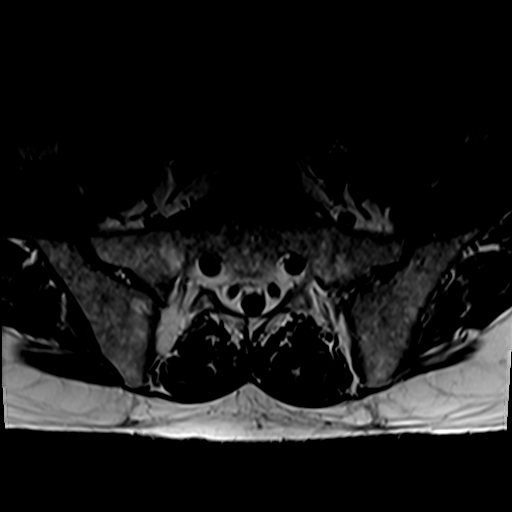
[im 6/36]
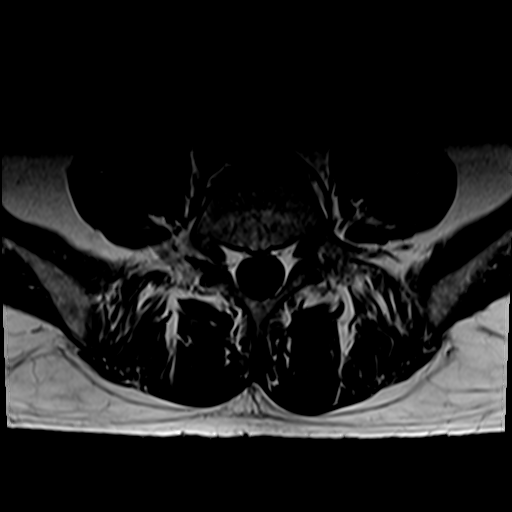
[im 11/36]
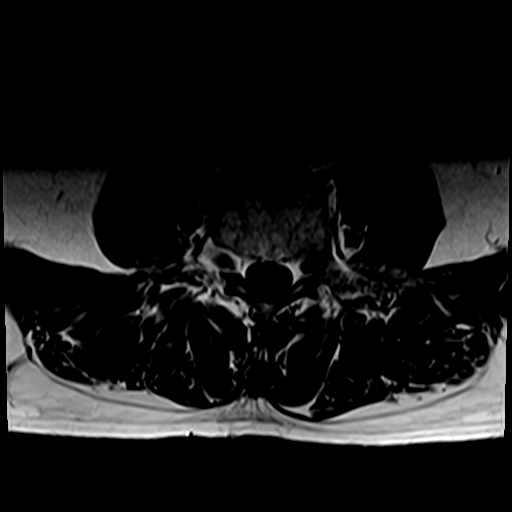
[im 17/36]
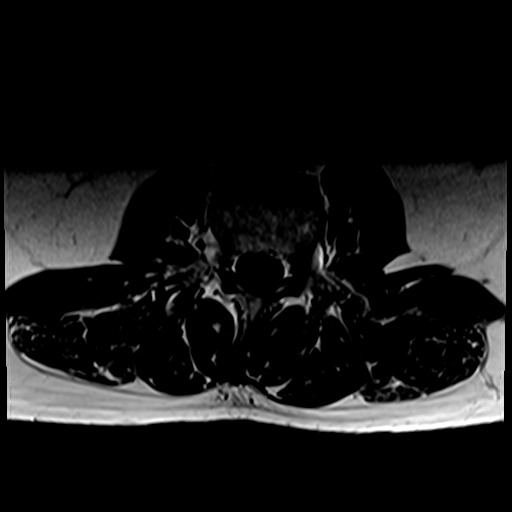
[im 19/36]
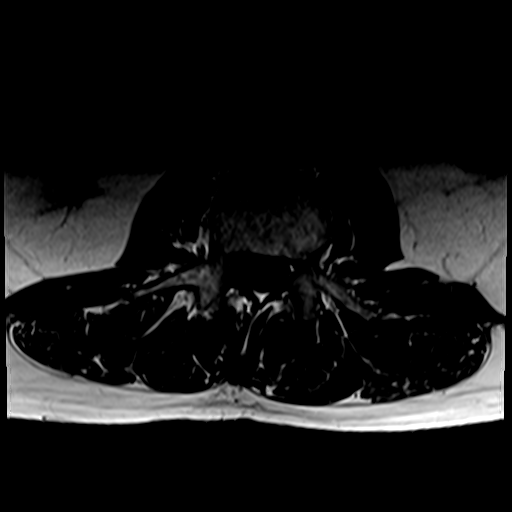
[im 25/36]
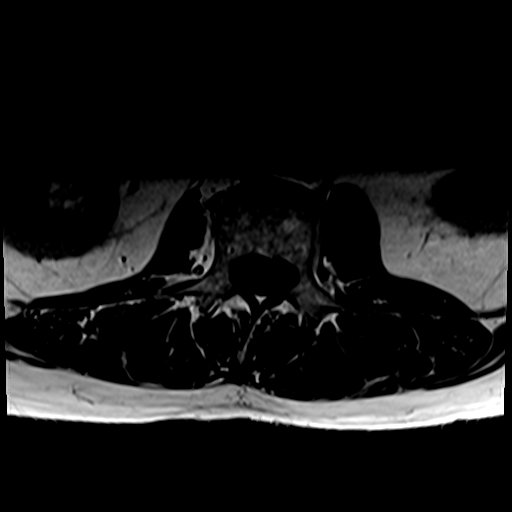
[im 30/36]
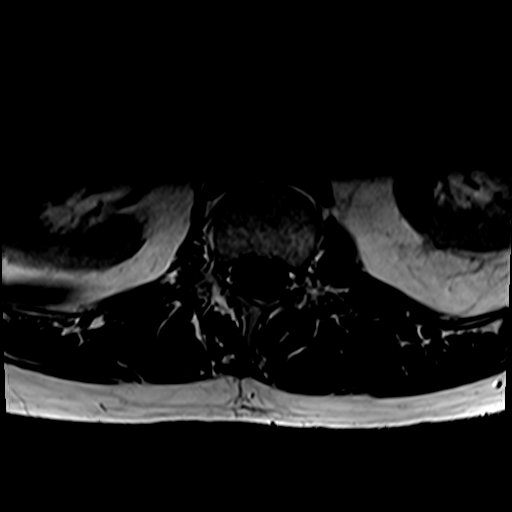
[im 36/36]
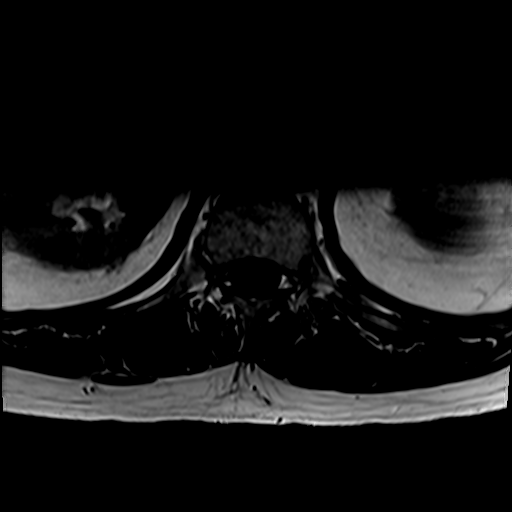

[31 of 48 positions shown; findings below may reference images not displayed]

FINDINGS: Segmentation:  Standard.

Alignment:  Physiologic.

Vertebrae:  No fracture, evidence of discitis, or bone lesion.

Conus medullaris and cauda equina: Conus extends to the L1 level.
Conus and cauda equina appear normal.

Paraspinal and other soft tissues: Negative.

Disc levels:

T12-L1: Unremarkable.

L1-L2: Unremarkable.

L2-L3: Disc desiccation with minimal annular disc bulge.
Unremarkable facet joints. No foraminal or canal stenosis.

L3-L4: Unremarkable.

L4-L5: Unremarkable.

L5-S1: Disc desiccation with shallow central disc protrusion.
Unremarkable facet joints. Mild left foraminal stenosis. No canal
stenosis.
IMPRESSION: 1. Mild degenerative disc disease of L2-L3 and L5-S1.
2. Mild left foraminal stenosis at L5-S1. No canal stenosis at any
level.
